# Patient Record
Sex: Female | Born: 1940 | Race: White | Hispanic: No | Marital: Married
Health system: Midwestern US, Academic
[De-identification: ages and names within clinical notes are randomized; demographics above are authoritative.]

---

## 2017-01-29 ENCOUNTER — Encounter: Admit: 2017-01-29 | Discharge: 2017-01-29 | Payer: MEDICARE

## 2017-01-29 NOTE — Progress Notes
Received faxed request for patient to hold plavix 5-7 days prior to oral surgery.  Confirmed with patient and our records no PCI in the last year.  Reviewed with TLR and okay to hold for oral surgery.  Note faxed back to [facility] with okay to hold.  Fax # [phone]

## 2017-06-22 ENCOUNTER — Encounter: Admit: 2017-06-22 | Discharge: 2017-06-22 | Payer: MEDICARE

## 2017-06-22 MED ORDER — ATORVASTATIN 20 MG PO TAB
ORAL_TABLET | Freq: Every day | 11 refills
Start: 2017-06-22 — End: ?

## 2017-06-29 ENCOUNTER — Encounter: Admit: 2017-06-29 | Discharge: 2017-06-29 | Payer: MEDICARE

## 2017-06-29 MED ORDER — ATORVASTATIN 20 MG PO TAB
ORAL_TABLET | Freq: Every day | 11 refills
Start: 2017-06-29 — End: ?

## 2017-07-01 ENCOUNTER — Encounter: Admit: 2017-07-01 | Discharge: 2017-07-01 | Payer: MEDICARE

## 2017-07-01 MED ORDER — ATORVASTATIN 20 MG PO TAB
ORAL_TABLET | Freq: Every day | 11 refills
Start: 2017-07-01 — End: ?

## 2017-07-06 LAB — TROPONIN-I

## 2017-07-06 LAB — COMPREHENSIVE METABOLIC PANEL
Lab: 104
Lab: 142
Lab: 15 — ABNORMAL HIGH (ref 0–14)

## 2017-07-06 LAB — LIPASE: Lab: 23

## 2017-07-15 LAB — BASIC METABOLIC PANEL
Lab: 1.3 — ABNORMAL HIGH (ref 0.57–1.11)
Lab: 105 — ABNORMAL HIGH (ref 83–110)
Lab: 139
Lab: 15
Lab: 17 — ABNORMAL HIGH (ref 0–14)
Lab: 182 — ABNORMAL HIGH (ref 83–110)
Lab: 21 — ABNORMAL LOW (ref 23–31)
Lab: 4.4
Lab: 41
Lab: 9.8 — ABNORMAL HIGH (ref 40–150)

## 2017-08-28 ENCOUNTER — Encounter: Admit: 2017-08-28 | Discharge: 2017-08-28 | Payer: MEDICARE

## 2017-08-28 MED ORDER — AMLODIPINE 5 MG PO TAB
ORAL_TABLET | Freq: Every day | 0 refills | Status: AC
Start: 2017-08-28 — End: 2017-09-29

## 2017-09-29 ENCOUNTER — Encounter: Admit: 2017-09-29 | Discharge: 2017-09-29 | Payer: MEDICARE

## 2017-09-29 MED ORDER — AMLODIPINE 5 MG PO TAB
ORAL_TABLET | Freq: Every day | 0 refills | Status: AC
Start: 2017-09-29 — End: 2017-11-17

## 2017-10-02 ENCOUNTER — Encounter: Admit: 2017-10-02 | Discharge: 2017-10-02 | Payer: MEDICARE

## 2017-10-02 MED ORDER — AMLODIPINE 5 MG PO TAB
ORAL_TABLET | Freq: Every day | 0 refills
Start: 2017-10-02 — End: ?

## 2017-10-05 ENCOUNTER — Encounter: Admit: 2017-10-05 | Discharge: 2017-10-05 | Payer: MEDICARE

## 2017-10-05 MED ORDER — AMLODIPINE 5 MG PO TAB
ORAL_TABLET | Freq: Every day | 0 refills
Start: 2017-10-05 — End: ?

## 2017-10-20 ENCOUNTER — Encounter: Admit: 2017-10-20 | Discharge: 2017-10-20 | Payer: MEDICARE

## 2017-10-20 ENCOUNTER — Ambulatory Visit: Admit: 2017-10-20 | Discharge: 2017-10-21 | Payer: MEDICARE

## 2017-10-20 DIAGNOSIS — E039 Hypothyroidism, unspecified: ICD-10-CM

## 2017-10-20 DIAGNOSIS — I2510 Atherosclerotic heart disease of native coronary artery without angina pectoris: Principal | ICD-10-CM

## 2017-10-20 DIAGNOSIS — M3500 Sicca syndrome, unspecified: ICD-10-CM

## 2017-10-20 DIAGNOSIS — I739 Peripheral vascular disease, unspecified: ICD-10-CM

## 2017-10-20 DIAGNOSIS — M797 Fibromyalgia: ICD-10-CM

## 2017-10-20 DIAGNOSIS — R0602 Shortness of breath: ICD-10-CM

## 2017-10-20 DIAGNOSIS — Z9889 Other specified postprocedural states: ICD-10-CM

## 2017-10-20 DIAGNOSIS — I10 Essential (primary) hypertension: ICD-10-CM

## 2017-10-20 DIAGNOSIS — I25119 Atherosclerotic heart disease of native coronary artery with unspecified angina pectoris: Principal | ICD-10-CM

## 2017-10-20 DIAGNOSIS — K743 Primary biliary cirrhosis: ICD-10-CM

## 2017-10-20 DIAGNOSIS — J439 Emphysema, unspecified: ICD-10-CM

## 2017-10-20 DIAGNOSIS — Z87891 Personal history of nicotine dependence: ICD-10-CM

## 2017-10-20 DIAGNOSIS — I779 Disorder of arteries and arterioles, unspecified: ICD-10-CM

## 2017-10-20 DIAGNOSIS — E785 Hyperlipidemia, unspecified: Secondary | ICD-10-CM

## 2017-10-20 MED ORDER — AZITHROMYCIN 250 MG PO TAB
ORAL_TABLET | Freq: Every day | ORAL | 0 refills | Status: AC
Start: 2017-10-20 — End: ?

## 2017-11-17 ENCOUNTER — Encounter: Admit: 2017-11-17 | Discharge: 2017-11-17 | Payer: MEDICARE

## 2017-11-17 MED ORDER — AMLODIPINE 5 MG PO TAB
5 mg | ORAL_TABLET | Freq: Every day | ORAL | 3 refills | Status: AC
Start: 2017-11-17 — End: 2018-12-31

## 2018-01-19 ENCOUNTER — Encounter: Admit: 2018-01-19 | Discharge: 2018-01-19 | Payer: MEDICARE

## 2018-06-10 ENCOUNTER — Encounter: Admit: 2018-06-10 | Discharge: 2018-06-10 | Payer: MEDICARE

## 2018-06-15 ENCOUNTER — Encounter: Admit: 2018-06-15 | Discharge: 2018-06-15 | Payer: MEDICARE

## 2018-07-15 ENCOUNTER — Encounter: Admit: 2018-07-15 | Discharge: 2018-07-15 | Payer: MEDICARE

## 2018-12-14 ENCOUNTER — Encounter: Admit: 2018-12-14 | Discharge: 2018-12-14

## 2018-12-14 ENCOUNTER — Ambulatory Visit: Admit: 2018-12-14 | Discharge: 2018-12-15

## 2018-12-14 DIAGNOSIS — I779 Disorder of arteries and arterioles, unspecified: Secondary | ICD-10-CM

## 2018-12-14 DIAGNOSIS — E039 Hypothyroidism, unspecified: Secondary | ICD-10-CM

## 2018-12-14 DIAGNOSIS — I10 Essential (primary) hypertension: Secondary | ICD-10-CM

## 2018-12-14 DIAGNOSIS — I2510 Atherosclerotic heart disease of native coronary artery without angina pectoris: Secondary | ICD-10-CM

## 2018-12-14 DIAGNOSIS — Z9889 Other specified postprocedural states: Secondary | ICD-10-CM

## 2018-12-14 DIAGNOSIS — J439 Emphysema, unspecified: Secondary | ICD-10-CM

## 2018-12-14 DIAGNOSIS — M797 Fibromyalgia: Secondary | ICD-10-CM

## 2018-12-14 DIAGNOSIS — I739 Peripheral vascular disease, unspecified: Secondary | ICD-10-CM

## 2018-12-14 DIAGNOSIS — Z87891 Personal history of nicotine dependence: Secondary | ICD-10-CM

## 2018-12-14 DIAGNOSIS — K743 Primary biliary cirrhosis: Secondary | ICD-10-CM

## 2018-12-14 DIAGNOSIS — I25119 Atherosclerotic heart disease of native coronary artery with unspecified angina pectoris: Secondary | ICD-10-CM

## 2018-12-14 DIAGNOSIS — E785 Hyperlipidemia, unspecified: Secondary | ICD-10-CM

## 2018-12-14 DIAGNOSIS — M3500 Sicca syndrome, unspecified: Secondary | ICD-10-CM

## 2018-12-14 NOTE — Progress Notes
Date of Service: 12/14/2018    Kelly Case is a 78 y.o. female.       HPI     I saw Kelly Case today in followup of her previous non-STEMI/infarction, stents/angioplasties back 20 years ago.  Stents to the obtuse marginal and right coronary arteries.  She is not having any chest pain.  She is not smoking.  She feels well, stays active, and is self isolating.  She is going to be following with Theodoro Kalata soon regarding her pulmonary fibrosis and uses oxygen.    ([id number])             Vitals:    12/14/18 1410   BP: 126/58   BP Source: Arm, Left Upper;Arm, Right Upper   Pulse: 95   Temp: 36.5 ???C (97.7 ???F)   SpO2: 97%   Weight: 54 kg (119 lb)   Height: 1.575 m (5' 2)   PainSc: Zero     Body mass index is 21.77 kg/m???.     Past Medical History  Patient Active Problem List    Diagnosis Date Noted   ??? Chest pain 09/22/2013   ??? Shortness of breath 07/28/2011   ??? Dyspnea 07/28/2011   ??? CAD (coronary artery disease) 09/27/2008     06/27/01: NonSTEMI    06/28/01: Stent PCI of OMB and RCA     ??? Carotid artery disease (HCC) 09/27/2008   ??? History of carotid endarterectomy 09/27/2008   ??? Hypertension 09/27/2008   ??? Hyperlipidemia 09/27/2008   ??? History of tobacco use 09/27/2008   ??? 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 Emphysema 09/27/2008   ??? Hypothyroidism 09/27/2008   ??? Sjogren's syndrome (HCC) 09/27/2008   ??? Fibromyalgia 09/27/2008         Review of Systems   Constitution: Negative.   HENT: Negative.    Eyes: Negative.    Cardiovascular: Negative.    Respiratory: Negative.    Endocrine: Negative.    Hematologic/Lymphatic: Negative.    Skin: Negative.    Musculoskeletal: Negative.    Gastrointestinal: Negative.    Genitourinary: Negative.    Neurological: Negative.    Psychiatric/Behavioral: Negative.    Allergic/Immunologic: Negative.        Physical Exam  Physical examination reveals a 78 year old trim lady, in no acute distress.  Talkative.  She has her oxygen available, not using it at the bedside.  She does wear a mask.  Neuromuscular:  She has no peripheral edema.  Her fingers are not swollen.  She has no arcus senilis.  Cranial nerve exam is benign.  Affect is normal.  Gait is normal.  Muscle strength appears to be normal.  No cyanosis or clubbing.    ([id number])        Cardiovascular Studies   12-lead electrocardiogram today shows normal sinus rhythm with borderline right axis deviation, delayed R-wave progression, nonspecific ST abnormalities.  QT is 376, QTc 460 msec.    ([id number])        Problems Addressed Today  No diagnosis found.    Assessment and Plan     In summary, this is a 78 year old lady with Sjogren syndrome, hypothyroidism, emphysema, tobacco use, carotid endarterectomy, and coronary artery disease.  She is otherwise asymptomatic.  I am not going to make changes to her medical regimen, but would like to see her back in December.    ([id number])             Current Medications (including today's revisions)  ???  amLODIPine (NORVASC) 5 mg tablet Take one tablet by mouth daily.   ??? amLODIPine (NORVASC) 5 mg tablet Take one tablet by mouth daily.   ??? aspirin EC 81 mg tablet Take 81 mg by mouth Daily.   ??? atorvastatin (LIPITOR) 20 mg tablet Take 1 tablet by mouth daily.   ??? BEVESPI AEROSPHERE 9-4.8 mcg HFA inhaler Inhale 2 puffs by mouth into the lungs twice daily.   ??? cevimeline,+, (EVOXAC) 30 mg Cap Take 30 mg by mouth Twice Daily.   ??? clopidogrel (PLAVIX) 75 mg Tab Take 75 mg by mouth Daily.   ??? ELIQUIS 5 mg tablet Take 1 tablet by mouth twice daily.   ??? isosorbide mononitrate SR (IMDUR) 30 mg tablet TAKE 1 TABLET BY MOUTH EVERY DAY   ??? lansoprazole DR (PREVACID) 30 mg PO capsule Take 30 mg by mouth twice daily.   ??? levothyroxine (SYNTHROID) 75 mcg tablet Take 75 mcg by mouth daily.   ??? linagliptin 5 mg tab Take 1 tablet by mouth daily.   ??? LORazepam (ATIVAN) 0.5 mg tablet Take 1 tablet by mouth twice daily.   ??? memantine (NAMENDA) 5 mg tablet Take 1 tablet by mouth daily. ??? ondansetron (ZOFRAN) 4 mg tablet Take 1 tablet by mouth as Needed.   ??? pantoprazole DR (PROTONIX) 40 mg tablet Take 1 tablet by mouth daily.   ??? prednisone (DELTASONE) 10 mg tablet Take 4 tablets by mouth daily.   ??? pregabalin (LYRICA) 75 mg capsule Take 75 mg by mouth twice daily.     ??? promethazine-codeine (PHENERGAN W/CODEINE) 6.25-10 mg/5 mL oral syrup at bedtime daily.   ??? SIMETHICONE (GAS-X EXTRA STRENGTH PO) Take  by mouth.   ??? ursodiol (URSO) 250 mg tablet Take 2 Tabs by mouth Twice Daily.   ??? venlafaxine XR (EFFEXOR XR) 150 mg capsule Take 150 mg by mouth Daily.

## 2018-12-17 ENCOUNTER — Encounter: Admit: 2018-12-17 | Discharge: 2018-12-17

## 2018-12-31 ENCOUNTER — Encounter: Admit: 2018-12-31 | Discharge: 2018-12-31

## 2018-12-31 MED ORDER — AMLODIPINE 5 MG PO TAB
ORAL_TABLET | Freq: Every day | 3 refills | Status: DC
Start: 2018-12-31 — End: 2018-12-31

## 2018-12-31 MED ORDER — AMLODIPINE 5 MG PO TAB
5 mg | ORAL_TABLET | Freq: Every day | ORAL | 3 refills | Status: DC
Start: 2018-12-31 — End: 2019-11-22

## 2019-04-26 ENCOUNTER — Encounter: Admit: 2019-04-26 | Discharge: 2019-04-26 | Payer: MEDICARE

## 2019-04-26 DIAGNOSIS — R079 Chest pain, unspecified: Secondary | ICD-10-CM

## 2019-04-26 DIAGNOSIS — E785 Hyperlipidemia, unspecified: Secondary | ICD-10-CM

## 2019-04-26 DIAGNOSIS — I2510 Atherosclerotic heart disease of native coronary artery without angina pectoris: Secondary | ICD-10-CM

## 2019-04-26 DIAGNOSIS — I739 Peripheral vascular disease, unspecified: Secondary | ICD-10-CM

## 2019-04-26 DIAGNOSIS — I779 Disorder of arteries and arterioles, unspecified: Secondary | ICD-10-CM

## 2019-04-26 DIAGNOSIS — M3500 Sicca syndrome, unspecified: Secondary | ICD-10-CM

## 2019-04-26 DIAGNOSIS — J439 Emphysema, unspecified: Secondary | ICD-10-CM

## 2019-04-26 DIAGNOSIS — Z87891 Personal history of nicotine dependence: Secondary | ICD-10-CM

## 2019-04-26 DIAGNOSIS — R0600 Dyspnea, unspecified: Secondary | ICD-10-CM

## 2019-04-26 DIAGNOSIS — I10 Essential (primary) hypertension: Secondary | ICD-10-CM

## 2019-04-26 DIAGNOSIS — I25119 Atherosclerotic heart disease of native coronary artery with unspecified angina pectoris: Secondary | ICD-10-CM

## 2019-04-26 DIAGNOSIS — E039 Hypothyroidism, unspecified: Secondary | ICD-10-CM

## 2019-04-26 DIAGNOSIS — Z9889 Other specified postprocedural states: Secondary | ICD-10-CM

## 2019-04-26 DIAGNOSIS — K743 Primary biliary cirrhosis: Secondary | ICD-10-CM

## 2019-04-26 DIAGNOSIS — M797 Fibromyalgia: Secondary | ICD-10-CM

## 2019-04-26 NOTE — Telephone Encounter
Patient saw Dr. Aris Georgia today and had labs drawn following appointment. Dr. Aris Georgia reviewed labs in person. Patient's daughter, Mickel Baas, notified TSH (20.20) result is elevated and patient needs to follow up with her PCP to have synthroid dosage adjusted. Daughter verbalizes understanding.

## 2019-04-27 ENCOUNTER — Encounter: Admit: 2019-04-27 | Discharge: 2019-04-27 | Payer: MEDICARE

## 2019-04-27 DIAGNOSIS — R079 Chest pain, unspecified: Secondary | ICD-10-CM

## 2019-04-27 DIAGNOSIS — R0600 Dyspnea, unspecified: Secondary | ICD-10-CM

## 2019-04-27 DIAGNOSIS — I25119 Atherosclerotic heart disease of native coronary artery with unspecified angina pectoris: Secondary | ICD-10-CM

## 2019-11-20 ENCOUNTER — Encounter: Admit: 2019-11-20 | Discharge: 2019-11-20 | Payer: MEDICARE

## 2019-11-22 MED ORDER — AMLODIPINE 5 MG PO TAB
ORAL_TABLET | Freq: Every day | 3 refills | Status: AC
Start: 2019-11-22 — End: ?

## 2020-03-19 ENCOUNTER — Encounter: Admit: 2020-03-19 | Discharge: 2020-03-19 | Payer: MEDICARE

## 2020-03-20 ENCOUNTER — Encounter: Admit: 2020-03-20 | Discharge: 2020-03-20 | Payer: MEDICARE

## 2020-03-20 DIAGNOSIS — M797 Fibromyalgia: Secondary | ICD-10-CM

## 2020-03-20 DIAGNOSIS — E785 Hyperlipidemia, unspecified: Secondary | ICD-10-CM

## 2020-03-20 DIAGNOSIS — M3500 Sicca syndrome, unspecified: Secondary | ICD-10-CM

## 2020-03-20 DIAGNOSIS — K743 Primary biliary cirrhosis: Secondary | ICD-10-CM

## 2020-03-20 DIAGNOSIS — Z9889 Other specified postprocedural states: Secondary | ICD-10-CM

## 2020-03-20 DIAGNOSIS — E039 Hypothyroidism, unspecified: Secondary | ICD-10-CM

## 2020-03-20 DIAGNOSIS — I10 Essential (primary) hypertension: Secondary | ICD-10-CM

## 2020-03-20 DIAGNOSIS — I779 Disorder of arteries and arterioles, unspecified: Secondary | ICD-10-CM

## 2020-03-20 DIAGNOSIS — J439 Emphysema, unspecified: Secondary | ICD-10-CM

## 2020-03-20 DIAGNOSIS — Z87891 Personal history of nicotine dependence: Secondary | ICD-10-CM

## 2020-03-20 DIAGNOSIS — I2510 Atherosclerotic heart disease of native coronary artery without angina pectoris: Secondary | ICD-10-CM

## 2020-03-20 DIAGNOSIS — I739 Peripheral vascular disease, unspecified: Secondary | ICD-10-CM

## 2020-11-27 ENCOUNTER — Encounter: Admit: 2020-11-27 | Discharge: 2020-11-27 | Payer: MEDICARE

## 2020-11-27 MED ORDER — AMLODIPINE 5 MG PO TAB
ORAL_TABLET | Freq: Every day | 3 refills | Status: AC
Start: 2020-11-27 — End: ?

## 2021-04-02 ENCOUNTER — Encounter: Admit: 2021-04-02 | Discharge: 2021-04-02 | Payer: MEDICARE

## 2021-04-02 DIAGNOSIS — Z85118 Personal history of other malignant neoplasm of bronchus and lung: Secondary | ICD-10-CM

## 2021-04-02 DIAGNOSIS — J449 Chronic obstructive pulmonary disease, unspecified: Secondary | ICD-10-CM

## 2022-07-22 IMAGING — CR [ID]
1 series · 1 of 1 positions shown · non-contrast
Comparison: none

[chest ap]
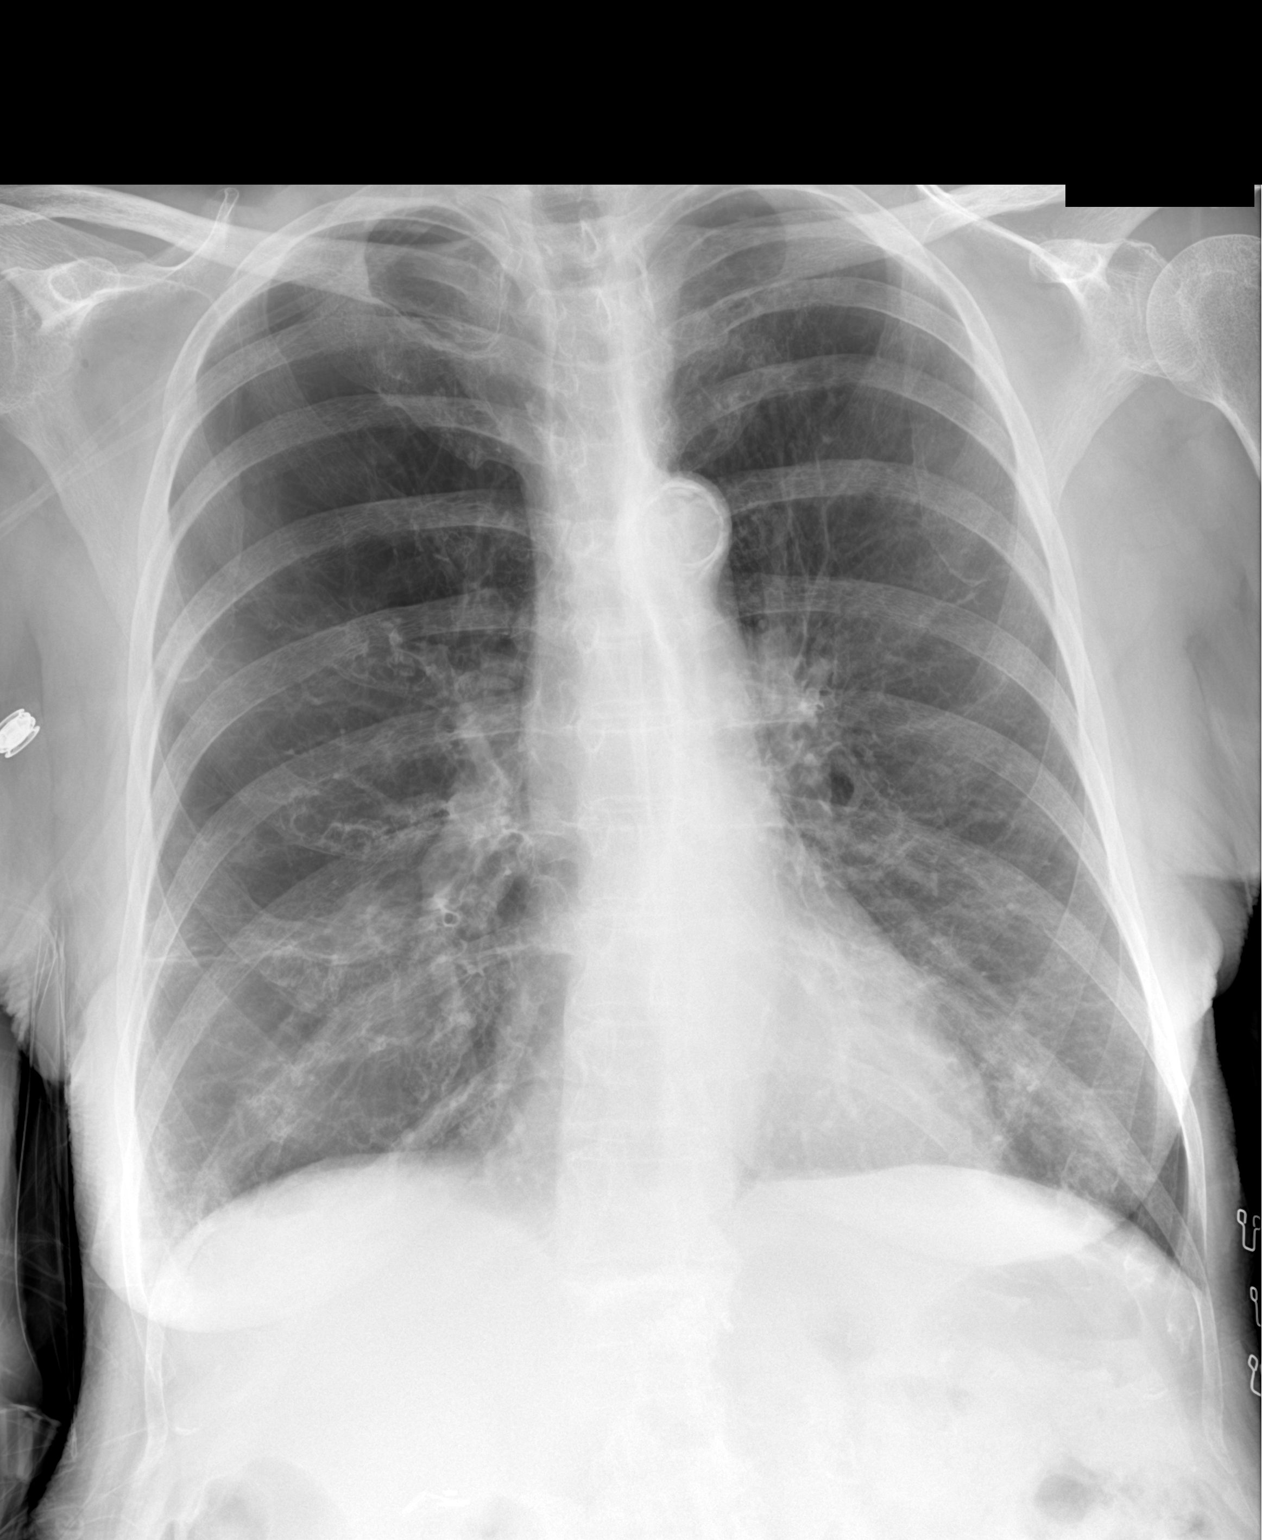

[1 of 1 positions shown; findings below may reference images not displayed]

DIAGNOSTIC STUDIES

EXAM

XR chest 1V

INDICATION

fever
fever; hx of copd and emphysema

TECHNIQUE

COMPARISONS

08/23/2020

FINDINGS

The lungs are hyperexpanded with emphysematous changes. No focal consolidation. No pleural effusion
or pneumothorax. Stable cardiomediastinal silhouette

IMPRESSION

No focal consolidation

Tech Notes:

fever; hx of copd and emphysema

## 2023-07-13 IMAGING — CR [ID]
1 series · 1 of 1 positions shown · non-contrast
Comparison: none

[x chest ap]
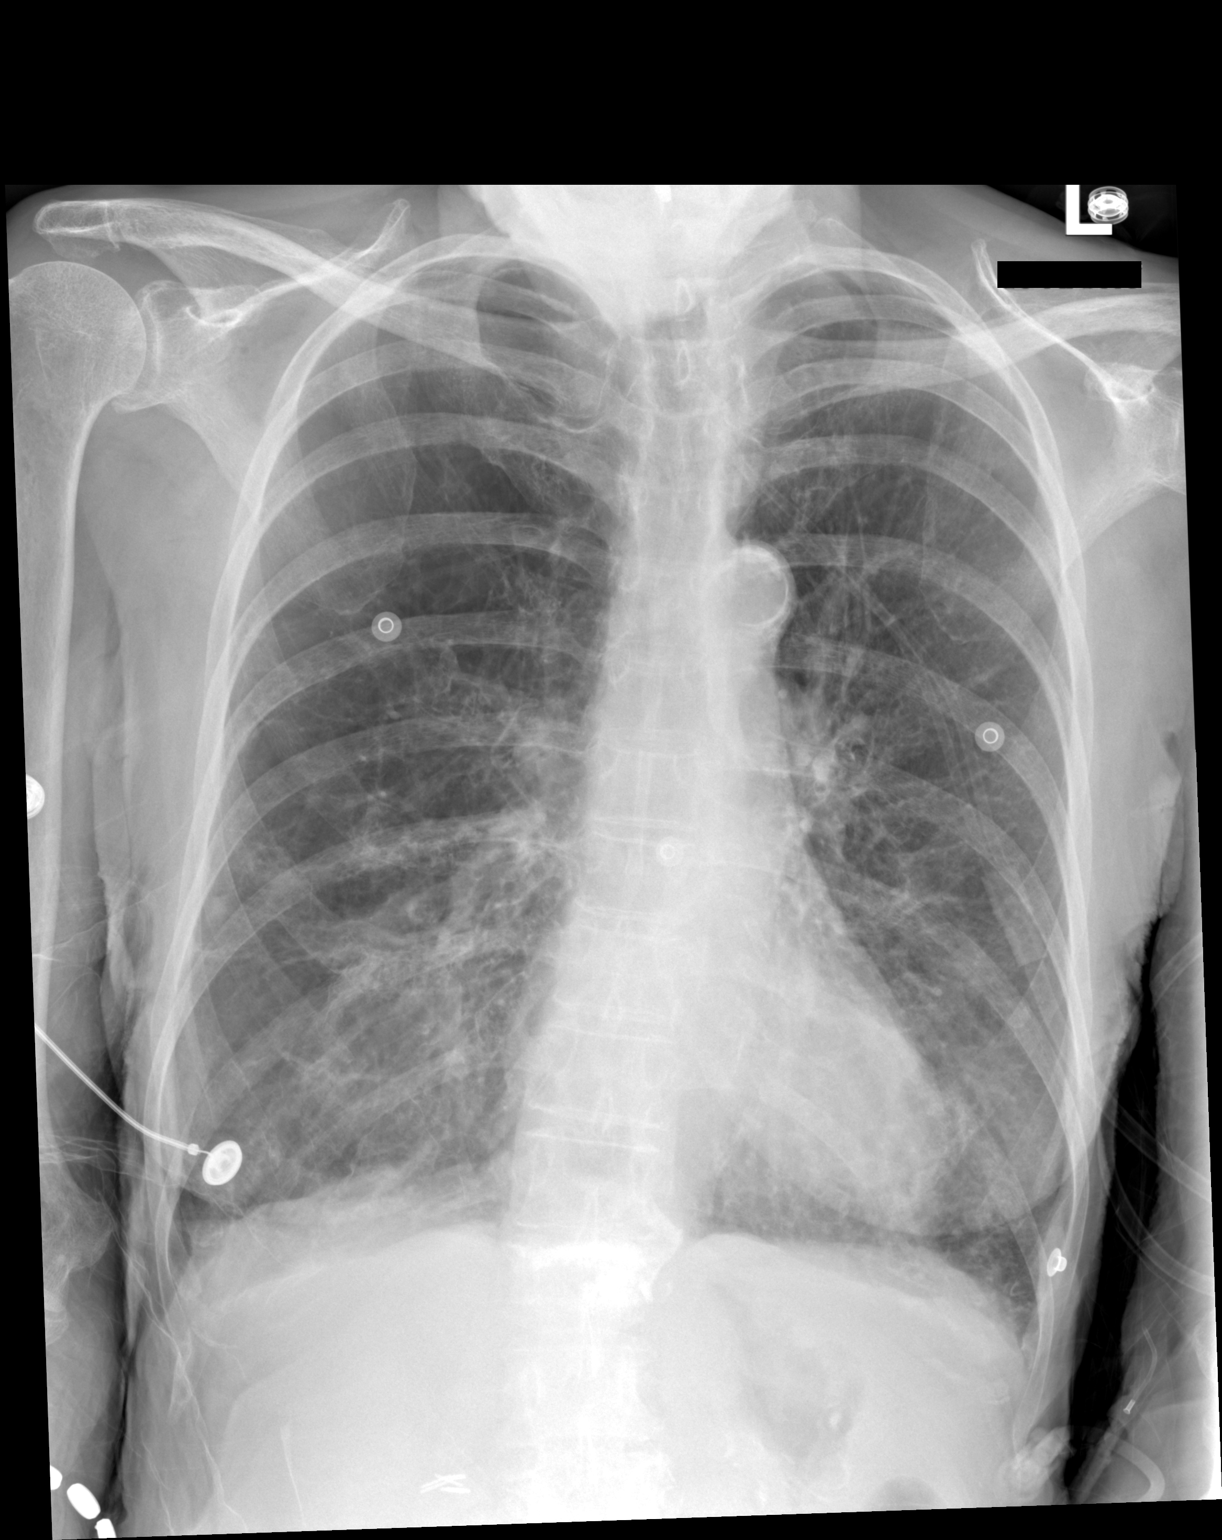

[1 of 1 positions shown; findings below may reference images not displayed]

EXAM

Portable chest x-ray

INDICATION

copd exac
copd exac

FINDINGS

A single portable view of the chest was obtained. The prior study was reviewed from 09/22/2021.

There is pronounced emphysematous lung disease with extensive bleb formation throughout the upper
lobes, right greater than left. There is flattening of the diaphragm.

There are chronic interstitial opacities throughout the central lower portions of both lungs.

There is no pulmonary consolidation. There is no pneumothorax.

The heart size is normal.

There is no acute osseous abnormality.

IMPRESSION

There is pronounced emphysematous lung disease without significant change from the prior study.
There is interstitial fibrosis throughout both lungs. There is no pneumothorax. No acute appearing
abnormality is identified.

Tech Notes:

copd exac
